# Patient Record
Sex: Female | Born: 2000 | Race: Black or African American | Hispanic: No | Marital: Single
Health system: Southern US, Community
[De-identification: ages and names within clinical notes are randomized; demographics above are authoritative.]

## PROBLEM LIST (undated history)

## (undated) DIAGNOSIS — D1800 Hemangioma unspecified site: Secondary | ICD-10-CM

## (undated) HISTORY — PX: OTHER SURGICAL HISTORY: SHX169

---
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
## 2022-02-23 DIAGNOSIS — J039 Acute tonsillitis, unspecified: Secondary | ICD-10-CM | POA: Diagnosis not present

## 2022-02-28 DIAGNOSIS — D1801 Hemangioma of skin and subcutaneous tissue: Secondary | ICD-10-CM | POA: Diagnosis not present

## 2022-09-04 DIAGNOSIS — D1801 Hemangioma of skin and subcutaneous tissue: Secondary | ICD-10-CM | POA: Diagnosis not present

## 2023-02-13 DIAGNOSIS — D1801 Hemangioma of skin and subcutaneous tissue: Secondary | ICD-10-CM | POA: Diagnosis not present
